# Patient Record
Sex: Female | Born: 1990 | Race: Black or African American | Hispanic: No | Marital: Single
Health system: Southern US, Community
[De-identification: ages and names within clinical notes are randomized; demographics above are authoritative.]

## PROBLEM LIST (undated history)

## (undated) DIAGNOSIS — J45909 Unspecified asthma, uncomplicated: Secondary | ICD-10-CM

## (undated) HISTORY — PX: WISDOM TOOTH EXTRACTION: SHX21

---

## 2010-12-27 ENCOUNTER — Inpatient Hospital Stay (HOSPITAL_COMMUNITY)
Admission: AD | Admit: 2010-12-27 | Discharge: 2010-12-27 | Disposition: A | Discharge status: Home / Self Care | Payer: PRIVATE HEALTH INSURANCE | Source: Ambulatory Visit | Attending: Obstetrics & Gynecology | Admitting: Obstetrics & Gynecology

## 2010-12-27 DIAGNOSIS — N946 Dysmenorrhea, unspecified: Secondary | ICD-10-CM | POA: Insufficient documentation

## 2010-12-27 LAB — URINALYSIS, ROUTINE W REFLEX MICROSCOPIC
Bilirubin Urine: NEGATIVE
Ketones, ur: NEGATIVE mg/dL
Leukocytes, UA: NEGATIVE
Nitrite: NEGATIVE
Specific Gravity, Urine: >1.03 — ABNORMAL HIGH (ref 1.005–1.030)
Urine Glucose, Fasting: NEGATIVE mg/dL
pH: 6 (ref 5.0–8.0)

## 2010-12-27 LAB — URINE MICROSCOPIC-ADD ON

## 2011-02-11 ENCOUNTER — Emergency Department (HOSPITAL_COMMUNITY)
Admission: EM | Admit: 2011-02-11 | Discharge: 2011-02-11 | Disposition: A | Discharge status: Home / Self Care | Payer: PRIVATE HEALTH INSURANCE | Attending: Emergency Medicine | Admitting: Emergency Medicine

## 2011-02-11 DIAGNOSIS — F1010 Alcohol abuse, uncomplicated: Secondary | ICD-10-CM | POA: Insufficient documentation

## 2014-01-31 ENCOUNTER — Emergency Department (HOSPITAL_BASED_OUTPATIENT_CLINIC_OR_DEPARTMENT_OTHER): Payer: No Typology Code available for payment source

## 2014-01-31 ENCOUNTER — Encounter (HOSPITAL_BASED_OUTPATIENT_CLINIC_OR_DEPARTMENT_OTHER): Payer: Self-pay | Admitting: Emergency Medicine

## 2014-01-31 ENCOUNTER — Emergency Department (HOSPITAL_BASED_OUTPATIENT_CLINIC_OR_DEPARTMENT_OTHER)
Admission: EM | Admit: 2014-01-31 | Discharge: 2014-01-31 | Disposition: A | Discharge status: Home / Self Care | Payer: No Typology Code available for payment source | Attending: Emergency Medicine | Admitting: Emergency Medicine

## 2014-01-31 DIAGNOSIS — R200 Anesthesia of skin: Secondary | ICD-10-CM | POA: Diagnosis present

## 2014-01-31 DIAGNOSIS — R5383 Other fatigue: Secondary | ICD-10-CM

## 2014-01-31 DIAGNOSIS — J45909 Unspecified asthma, uncomplicated: Secondary | ICD-10-CM | POA: Insufficient documentation

## 2014-01-31 DIAGNOSIS — R202 Paresthesia of skin: Secondary | ICD-10-CM

## 2014-01-31 DIAGNOSIS — R209 Unspecified disturbances of skin sensation: Secondary | ICD-10-CM | POA: Insufficient documentation

## 2014-01-31 DIAGNOSIS — R5381 Other malaise: Secondary | ICD-10-CM | POA: Insufficient documentation

## 2014-01-31 DIAGNOSIS — Z3202 Encounter for pregnancy test, result negative: Secondary | ICD-10-CM | POA: Insufficient documentation

## 2014-01-31 HISTORY — DX: Unspecified asthma, uncomplicated: J45.909

## 2014-01-31 LAB — PREGNANCY, URINE: Preg Test, Ur: NEGATIVE

## 2014-01-31 LAB — RAPID URINE DRUG SCREEN, HOSP PERFORMED
AMPHETAMINES: NOT DETECTED
Barbiturates: NOT DETECTED
Benzodiazepines: NOT DETECTED
Cocaine: NOT DETECTED
OPIATES: NOT DETECTED
TETRAHYDROCANNABINOL: POSITIVE — AB

## 2014-01-31 LAB — PROTIME-INR
INR: 1.05 (ref 0.00–1.49)
Prothrombin Time: 13.5 seconds (ref 11.6–15.2)

## 2014-01-31 LAB — COMPREHENSIVE METABOLIC PANEL
ALT: 13 U/L (ref 0–35)
AST: 20 U/L (ref 0–37)
Albumin: 4.8 g/dL (ref 3.5–5.2)
Alkaline Phosphatase: 84 U/L (ref 39–117)
BUN: 9 mg/dL (ref 6–23)
CALCIUM: 10 mg/dL (ref 8.4–10.5)
CO2: 26 meq/L (ref 19–32)
CREATININE: 0.8 mg/dL (ref 0.50–1.10)
Chloride: 106 mEq/L (ref 96–112)
GLUCOSE: 79 mg/dL (ref 70–99)
Potassium: 3.9 mEq/L (ref 3.7–5.3)
Sodium: 146 mEq/L (ref 137–147)
Total Bilirubin: 0.6 mg/dL (ref 0.3–1.2)
Total Protein: 8.2 g/dL (ref 6.0–8.3)

## 2014-01-31 LAB — CBC
HEMATOCRIT: 42.9 % (ref 36.0–46.0)
HEMOGLOBIN: 14.4 g/dL (ref 12.0–15.0)
MCH: 31.5 pg (ref 26.0–34.0)
MCHC: 33.6 g/dL (ref 30.0–36.0)
MCV: 93.9 fL (ref 78.0–100.0)
Platelets: 252 10*3/uL (ref 150–400)
RBC: 4.57 MIL/uL (ref 3.87–5.11)
RDW: 12.6 % (ref 11.5–15.5)
WBC: 7.3 10*3/uL (ref 4.0–10.5)

## 2014-01-31 LAB — TROPONIN I

## 2014-01-31 LAB — URINALYSIS, ROUTINE W REFLEX MICROSCOPIC
BILIRUBIN URINE: NEGATIVE
Glucose, UA: NEGATIVE mg/dL
HGB URINE DIPSTICK: NEGATIVE
Ketones, ur: 40 mg/dL — AB
Leukocytes, UA: NEGATIVE
Nitrite: NEGATIVE
PROTEIN: NEGATIVE mg/dL
Specific Gravity, Urine: 1.029 (ref 1.005–1.030)
UROBILINOGEN UA: 1 mg/dL (ref 0.0–1.0)
pH: 6 (ref 5.0–8.0)

## 2014-01-31 MED ORDER — PREDNISONE 10 MG PO TABS: 20.0000 mg | ORAL_TABLET | Freq: Every day | ORAL | Status: DC

## 2014-01-31 MED ORDER — PREDNISONE 20 MG PO TABS: 20.0000 mg | ORAL_TABLET | Freq: Every day | ORAL | Status: DC

## 2014-01-31 NOTE — ED Provider Notes (Signed)
CSN: [id number]     Arrival date & time 01/31/14  1731 History  This chart was scribed for Junius ArgyleForrest S Deeanne Deininger, MD by Elveria Risingimelie Horne, ED scribe.  This patient was seen in room MH07/MH07 and the patient's care was started at 6:27 PM.   Chief Complaint  Patient presents with  . Numbness      Patient is a 23 y.o. female presenting with extremity weakness. The history is provided by the patient. No language interpreter was used.  Extremity Weakness Pertinent negatives include no headaches. Nothing aggravates the symptoms. Nothing relieves the symptoms.   HPI Comments: Natalie Moreno is a 23 y.o. female who presents to the Emergency Department complaining of right arm numbness, onset last night. Patient reports that last night around 3am while engaging in intercourse she felt a sudden jolt like sensation that travelled up her arm. Patient immediately felt a numbness and tingling in right arm. Patient reports that the intercourse was not characteristically violent and she denies causative trauma or injury. Patient denies any pain or weakness in the arm, but reports feeling clumsy today at work.  Patient mentions tingling at the tip of her tongue briefly throughout the day and toe numbness that has presented intermittently for a few months. Patient shares that she has herpes for which she takes Valtrex when experiencing an outbreak. Patient is not currently experiencing an outbreak and hasn't for a while.    Past Medical History  Diagnosis Date  . Asthma    Past Surgical History  Procedure Laterality Date  . Wisdom tooth extraction     No family history on file. History  Substance Use Topics  . Smoking status: Not on file  . Smokeless tobacco: Not on file  . Alcohol Use: Not on file   OB History   Grav Para Term Preterm Abortions TAB SAB Ect Mult Living                 Review of Systems  Constitutional: Negative for fever.  HENT: Negative for congestion and sore throat.   Eyes: Negative  for pain.  Respiratory: Negative for choking and wheezing.   Gastrointestinal: Negative for vomiting and diarrhea.  Genitourinary: Negative for dysuria.  Musculoskeletal: Positive for extremity weakness. Negative for neck pain.  Skin: Negative for rash.  Allergic/Immunologic: Negative for immunocompromised state.  Neurological: Positive for numbness. Negative for weakness and headaches.  Hematological: Negative for adenopathy.  Psychiatric/Behavioral: Negative for behavioral problems.      Allergies  Flagyl  Home Medications   Current Outpatient Rx  Name  Route  Sig  Dispense  Refill  . valACYclovir (VALTREX) 500 MG tablet   Oral   Take 500 mg by mouth as needed.          Triage Vitals: BP 132/85  Pulse 62  Temp(Src) 98.9 F (37.2 C) (Oral)  Resp 20  Ht 5\' 8"  (1.727 m)  Wt 160 lb (72.576 kg)  BMI 24.33 kg/m2  SpO2 100%  LMP 01/18/2014 Physical Exam  Nursing note and vitals reviewed. Constitutional: She is oriented to person, place, and time. She appears well-developed and well-nourished. No distress.  HENT:  Head: Normocephalic and atraumatic.  Mouth/Throat: Oropharynx is clear and moist. No oropharyngeal exudate.  Eyes: Conjunctivae and EOM are normal. Pupils are equal, round, and reactive to light. Right eye exhibits no discharge. Left eye exhibits no discharge.  Neck: Neck supple. No tracheal deviation present.  Cardiovascular: Normal rate, regular rhythm, normal heart sounds and intact distal  pulses.  Exam reveals no gallop and no friction rub.   No murmur heard. Pulmonary/Chest: Effort normal and breath sounds normal. No respiratory distress. She has no wheezes. She has no rales. She exhibits no tenderness.  Abdominal: Soft. Bowel sounds are normal. She exhibits no distension and no mass. There is no tenderness. There is no rebound and no guarding.  Musculoskeletal: Normal range of motion. She exhibits no edema and no tenderness.  Neurological: She is alert and  oriented to person, place, and time. She has normal strength. A sensory deficit is present. No cranial nerve deficit. She displays a negative Romberg sign. Coordination and gait normal.  Reflex Scores:      Tricep reflexes are 2+ on the right side and 2+ on the left side.      Bicep reflexes are 2+ on the right side and 2+ on the left side.      Brachioradialis reflexes are 2+ on the right side and 2+ on the left side.      Patellar reflexes are 2+ on the right side and 2+ on the left side.      Achilles reflexes are 2+ on the right side and 2+ on the left side. alert, oriented x3 speech: normal in context and clarity memory: intact grossly cranial nerves II-XII: intact motor strength: full proximally and distally no involuntary movements or tremors Sensation: altered sensation to light touch diffusely in the right arm, otherwise intact to light touch  cerebellar: finger-to-nose and heel-to-shin intact gait: normal forwards and backwards, normal tandem gait   Skin: Skin is warm and dry. No rash noted. No erythema.  Psychiatric: She has a normal mood and affect. Her behavior is normal.    ED Course  Procedures (including critical care time) DIAGNOSTIC STUDIES: Oxygen Saturation is 100% on room air, normal by my interpretation.    COORDINATION OF CARE: 6:33 PM- Will order CT of head. Pt advised of plan for treatment and pt agrees.    Labs Review Labs Reviewed  URINE RAPID DRUG SCREEN (HOSP PERFORMED) - Abnormal; Notable for the following:    Tetrahydrocannabinol POSITIVE (*)    All other components within normal limits  URINALYSIS, ROUTINE W REFLEX MICROSCOPIC - Abnormal; Notable for the following:    Ketones, ur 40 (*)    All other components within normal limits  TROPONIN I  PROTIME-INR  CBC  COMPREHENSIVE METABOLIC PANEL  PREGNANCY, URINE   Imaging Review Ct Head Wo Contrast  01/31/2014   CLINICAL DATA:  Right arm numbness  EXAM: CT HEAD WITHOUT CONTRAST  TECHNIQUE:  Contiguous axial images were obtained from the base of the skull through the vertex without intravenous contrast. Study was obtained within 24 hr of patient's arrival at the emergency department.  COMPARISON:  None.  FINDINGS: The ventricles are normal in size and configuration. There is no mass, hemorrhage, extra-axial fluid collection, or midline shift. Gray-white compartments appear normal. No acute infarct apparent. Bony calvarium appears intact. Mastoid air cells are clear.  IMPRESSION: Study within normal limits. No intracranial mass, hemorrhage, or acute appearing infarct.   Electronically Signed   By: Bretta Bang M.D.   On: 01/31/2014 19:09     EKG Interpretation None      MDM   Final diagnoses:  Numbness and tingling of right arm    8:17 PM 23 y.o. female who presents with right arm numbness which began suddenly during intercourse at 3 AM this morning. Symptoms have persisted but have not worsened. She  has no weakness on exam or focal tenderness. She is afebrile and vital signs are unremarkable here. Screening imaging and lab work was performed. I discussed the case with Dr. Amada Jupiter (neurology), suspect cervical radiculopathy. Will get outpt neuro f/u and course of steroids.   8:18 PM: Pt continues to appear well. I interpreted/reviewed the labs and/or imaging which were non-contributory.   I have discussed the diagnosis/risks/treatment options with the patient and believe the pt to be eligible for discharge home to follow-up with Neurology as outpt. We also discussed returning to the ED immediately if new or worsening sx occur. We discussed the sx which are most concerning (e.g., weakness, worsening numbness, fever) that necessitate immediate return. Medications administered to the patient during their visit and any new prescriptions provided to the patient are listed below.  Medications given during this visit Medications - No data to display  Discharge Medication List as of  01/31/2014  8:32 PM    START taking these medications   Details  predniSONE (DELTASONE) 20 MG tablet Take 1 tablet (20 mg total) by mouth daily with breakfast. Take 3 tablets by mouth daily for 5 days. Then take 2 tablets daily by mouth for 3 days. Then take 1 tablet by mouth daily for 2 days., Starting 01/31/2014, Until Discontinued, Print          I personally performed the services described in this documentation, which was scribed in my presence. The recorded information has been reviewed and is accurate.    Junius Argyle, MD 02/01/14 [phone]

## 2014-01-31 NOTE — ED Notes (Signed)
Pt reports right arm became during intercourse this morning around 0300- states still fees "asleep"

## 2015-01-21 IMAGING — CT CT HEAD W/O CM
1 series · 16 of 30 positions shown, 20 images · non-contrast
Comparison: None.

CLINICAL DATA: Right arm numbness

EXAM:
CT HEAD WITHOUT CONTRAST
TECHNIQUE: Contiguous axial images were obtained from the base of the skull
through the vertex without intravenous contrast. Study was obtained
within 24 hr of patient's arrival at the emergency department.

[Series 2: head 4.8 h37s · axial · 0.45mm/px · z∈[-187,-54]mm · 16 of 32 slices shown, 20 images]
[im 2/32  brain]
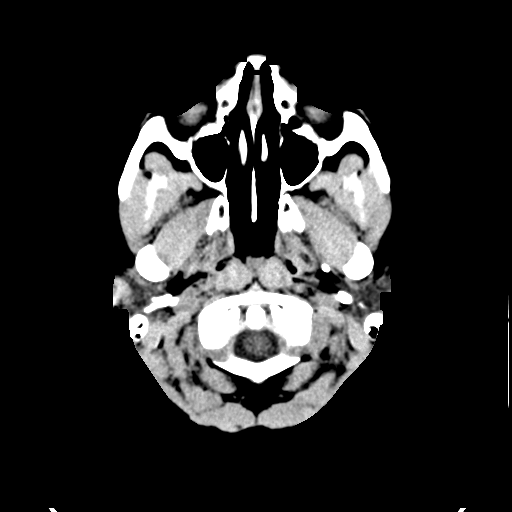
[im 2/32  bone]
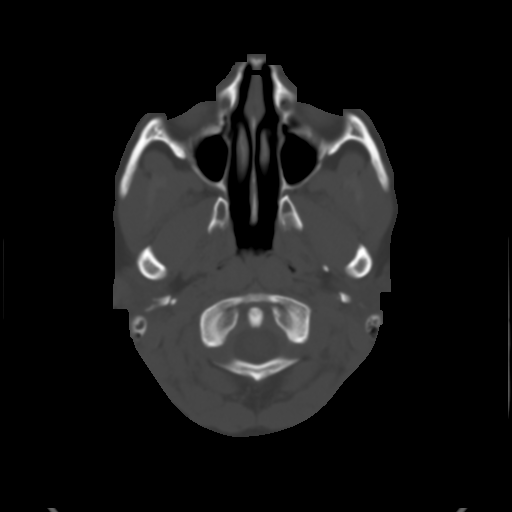
[im 4/32  brain]
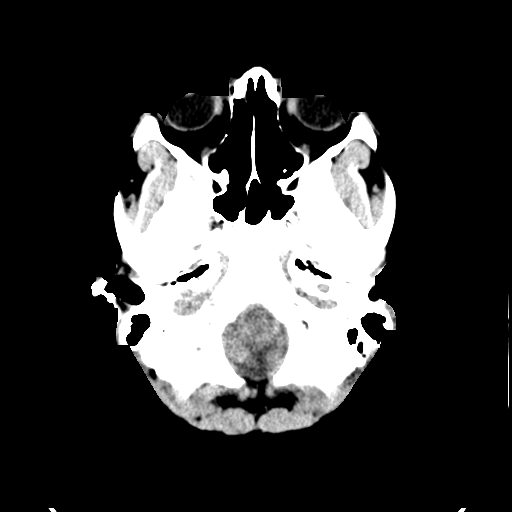
[im 6/32  brain]
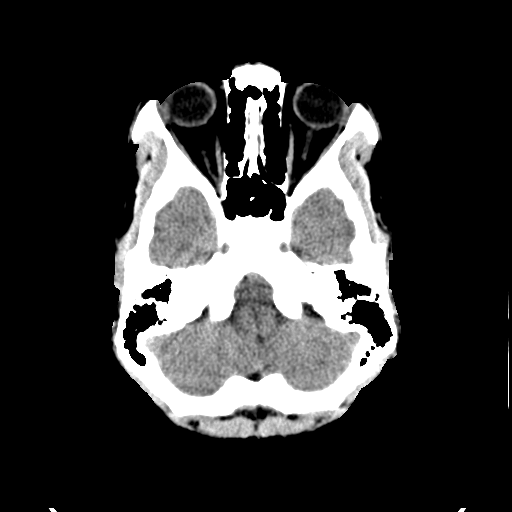
[im 8/32  brain]
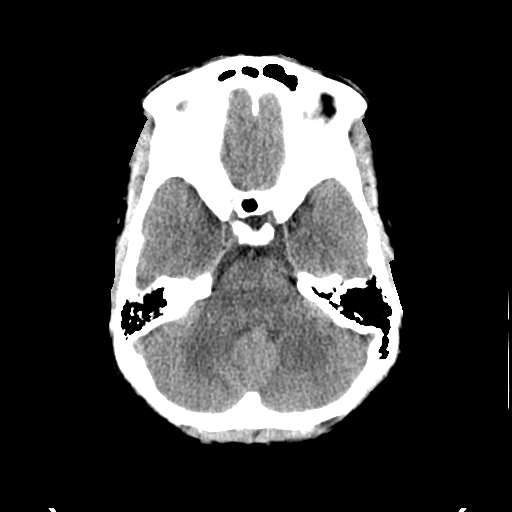
[im 9/32  brain]
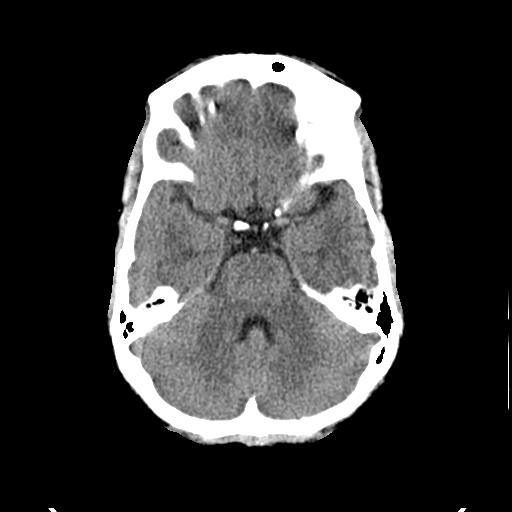
[im 9/32  bone]
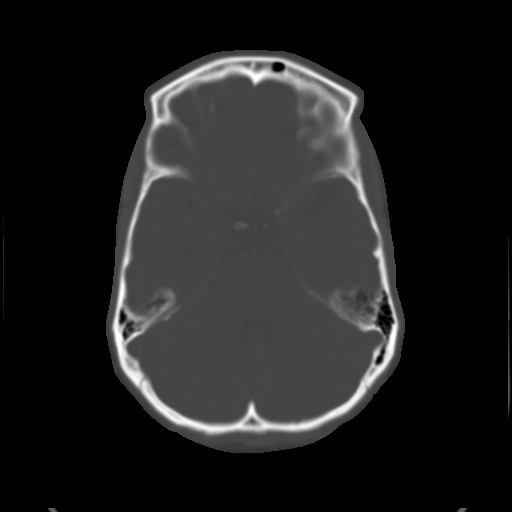
[im 11/32  brain]
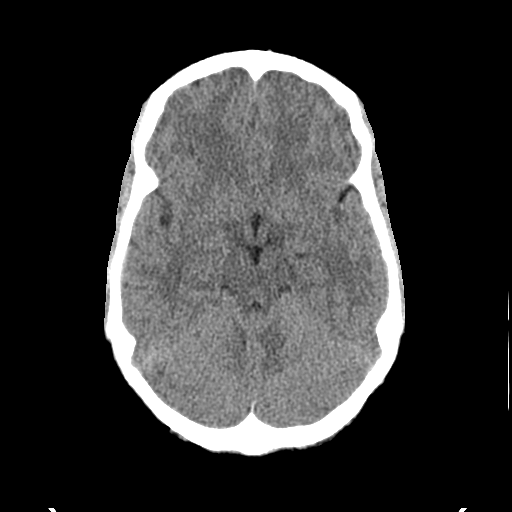
[im 13/32  brain]
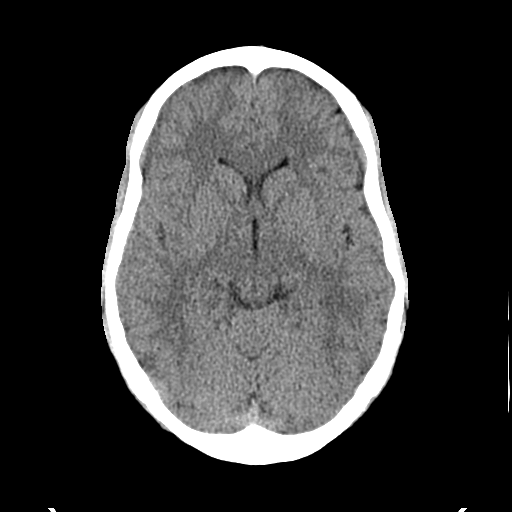
[im 15/32  brain]
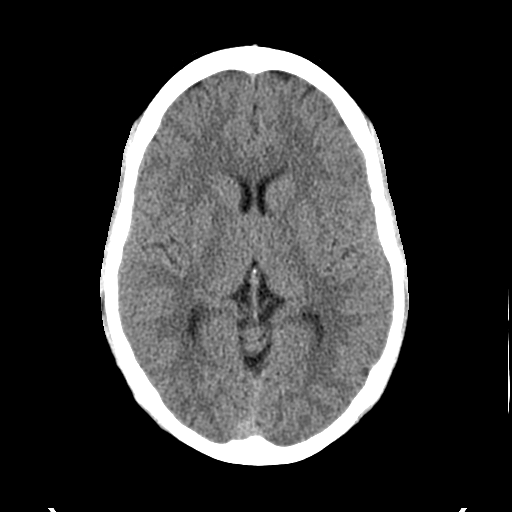
[im 17/32  brain]
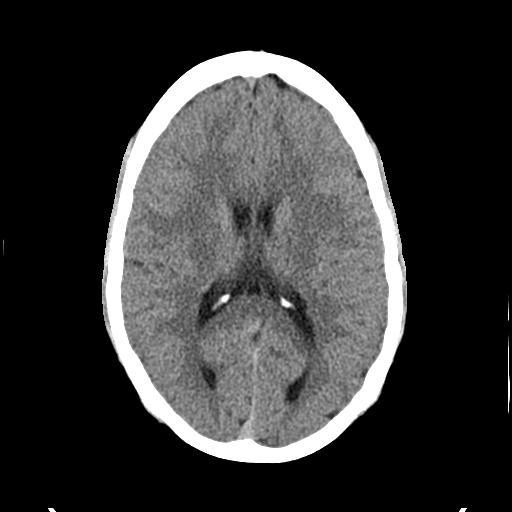
[im 17/32  bone]
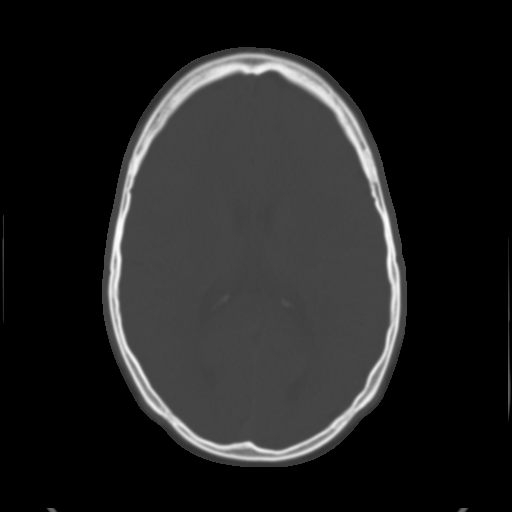
[im 19/32  brain]
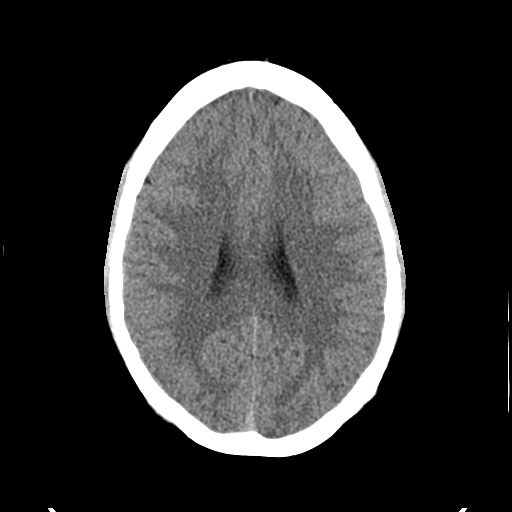
[im 21/32  brain]
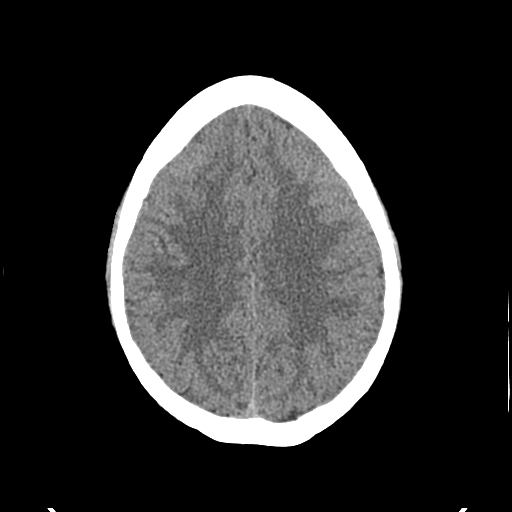
[im 23/32  brain]
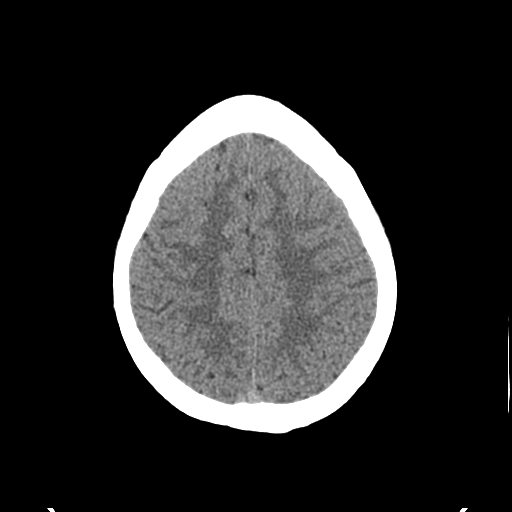
[im 24/32  brain]
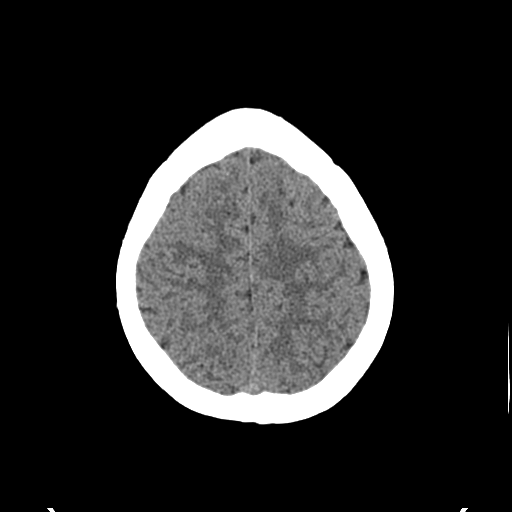
[im 24/32  bone]
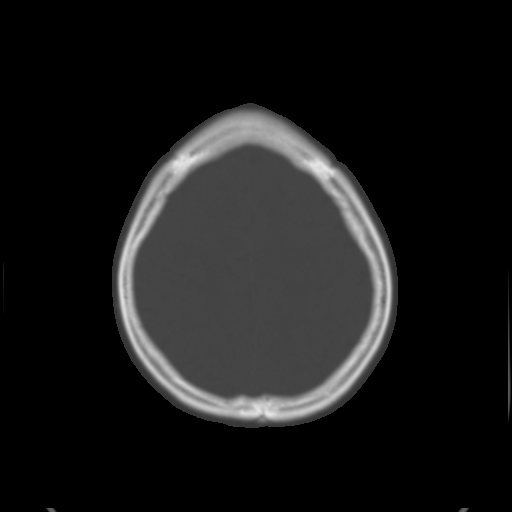
[im 26/32  brain]
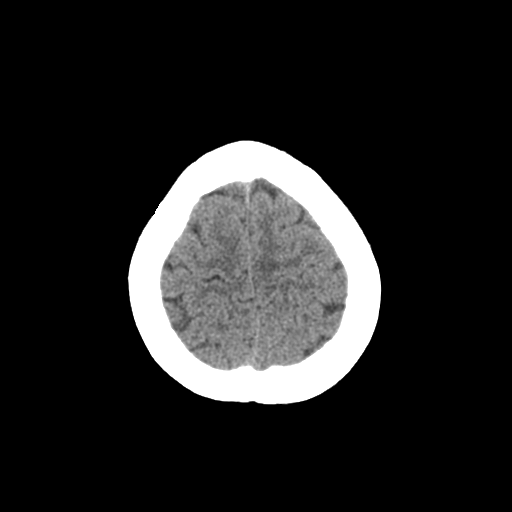
[im 28/32  brain]
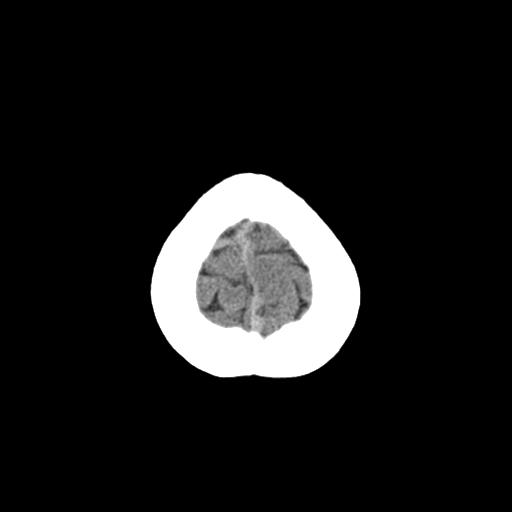
[im 30/32  brain]
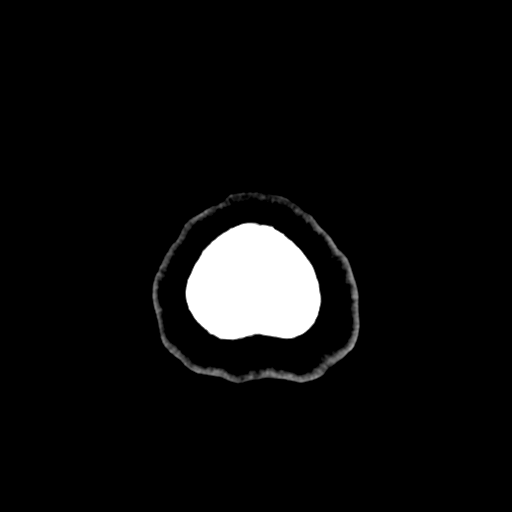

[16 of 30 positions shown; findings below may reference images not displayed]

FINDINGS: The ventricles are normal in size and configuration. There is no
mass, hemorrhage, extra-axial fluid collection, or midline shift.
Gray-white compartments appear normal. No acute infarct apparent.
Bony calvarium appears intact. Mastoid air cells are clear.
IMPRESSION: Study within normal limits. No intracranial mass, hemorrhage, or
acute appearing infarct.

## 2024-10-19 ENCOUNTER — Ambulatory Visit: Admission: EM | Admit: 2024-10-19 | Discharge: 2024-10-19 | Disposition: A | Discharge status: Home / Self Care

## 2024-10-19 DIAGNOSIS — H538 Other visual disturbances: Secondary | ICD-10-CM | POA: Diagnosis not present

## 2024-10-19 DIAGNOSIS — I160 Hypertensive urgency: Secondary | ICD-10-CM

## 2024-10-19 DIAGNOSIS — R519 Headache, unspecified: Secondary | ICD-10-CM

## 2024-10-19 DIAGNOSIS — R002 Palpitations: Secondary | ICD-10-CM | POA: Diagnosis not present

## 2024-10-19 NOTE — Discharge Instructions (Signed)
 Given your evaluation today and risk factors, I recommend you go directly to the ER for further imaging and evaluation.  Please noted our office is limited in the tests and imaging we can perform at our facility. Your evaluation was indicates a higher level of care is needed at this time.

## 2024-10-19 NOTE — ED Triage Notes (Signed)
 Patient reports her blood pressure has been elevated today. Reports being under a lot of stress. Blood pressure earlier today was 180 systolic. She has taken both doses of her blood pressure medication today. Patient reports a headache with blurred vision.

## 2024-10-19 NOTE — ED Provider Notes (Signed)
 " [facility]  Note:  This document was prepared using Dragon voice recognition software and may include unintentional dictation errors.  MRN: [id number] DOB: 01-01-1991 DATE: 10/19/2024   Subjective:  Chief Complaint:  Chief Complaint  Patient presents with   Hypertension   Dizziness   Headache    HPI: Natalie Moreno is a 33 y.o. female presenting for elevated blood pressure and palpitations for less than one day. Patient presents concerned for elevated blood pressure with vision changes and palpitations. Patient reports that his morning she woke up at BP was 186/104. She states at that time she took both her morning BP medication and evening medication at the same time. She reports ongoing headaches and blurred vision throughout the day as well as heart palpitations. She states that something similar happened last month and she was admitted due to elevated troponin at that time. She reports no chest pain or numbness/tingling, but did occur at her prior hospitalization. Patient states she has been under a lot of stress lately and had to put her cat down today. She is unsure if she had a panic attack earlier or not. Denies fever, nausea/vomiting, abdominal pain, numbness/tingling, chest pain, SOB. Endorses palpitations, headache, blurred vision, dizziness. Presents NAD.  Prior to Admission medications  Medication Sig Start Date End Date Taking? Authorizing Provider  irbesartan (AVAPRO) 75 MG tablet Take 75 mg by mouth daily.   Yes [provider]  metoprolol succinate (TOPROL-XL) 25 MG 24 hr tablet Take 25 mg by mouth. 09/18/24  Yes [provider]  valACYclovir (VALTREX) 500 MG tablet Take 500 mg by mouth as needed.    [provider]     Allergies[1]  History:   Past Medical History:  Diagnosis Date   Asthma      Past Surgical History:  Procedure Laterality Date   WISDOM TOOTH EXTRACTION      History reviewed. No pertinent family  history.  Social History[2]  Review of Systems  Constitutional:  Negative for fever.  Respiratory:  Negative for shortness of breath.   Cardiovascular:  Positive for palpitations. Negative for chest pain.  Gastrointestinal:  Negative for abdominal pain, nausea and vomiting.  Neurological:  Positive for dizziness and headaches. Negative for seizures, syncope and numbness.     Objective:   Vitals: BP (!) 153/100 (BP Location: Left Arm)   Pulse 98   Temp 99.4 F (37.4 C) (Oral)   Resp 18   LMP 10/01/2024 (Approximate)   SpO2 98%   Physical Exam Constitutional:      General: She is not in acute distress.    Appearance: Normal appearance. She is well-developed. She is obese. She is not ill-appearing or toxic-appearing.  HENT:     Head: Normocephalic and atraumatic.  Cardiovascular:     Rate and Rhythm: Normal rate and regular rhythm.     Heart sounds: Normal heart sounds.  Pulmonary:     Effort: Pulmonary effort is normal.     Breath sounds: Normal breath sounds.     Comments: Clear to auscultation bilaterally  Abdominal:     General: Bowel sounds are normal.     Palpations: Abdomen is soft.     Tenderness: There is no abdominal tenderness.  Skin:    General: Skin is warm and dry.  Neurological:     General: No focal deficit present.     Mental Status: She is alert and oriented to person, place, and time.     GCS: GCS eye subscore  is 4. GCS verbal subscore is 5. GCS motor subscore is 6.  Psychiatric:        Mood and Affect: Affect normal. Mood is depressed.     Results:  Labs: No results found for this or any previous visit (from the past 24 hours).  Radiology: No results found.   UC Course/Treatments:  Procedures: ED EKG  Date/Time: 10/19/2024 4:06 PM  Performed by: Basilia Ulanda SQUIBB, PA-C Authorized by: Basilia Ulanda SQUIBB, PA-C   Previous ECG:    Previous ECG:  Unavailable Rate:    ECG rate:  84   ECG rate assessment: normal   Rhythm:    Rhythm:  sinus rhythm   Ectopy:    Ectopy: none   QRS:    QRS axis:  Normal   QRS intervals:  Normal   QRS conduction: normal   ST segments:    ST segments:  Non-specific    Medications Ordered in UC: Medications - No data to display   Assessment and Plan :     ICD-10-CM   1. Palpitations  R00.2     2. Blurred vision  H53.8     3. Acute nonintractable headache, unspecified headache type  R51.9     4. Hypertensive urgency  I16.0       Palpitations Blurred vision Acute nonintractable headache, unspecified headache type Hypertensive urgency Patient presented to urgent care complaining of possible elevated blood pressure with palpitations and blurred vision.   BP was noted to be 153/100 at triage. She reports BP this morning was 186/104. She took both her irbesartan and metoprolol at that time.   Patient has history of hypertensive emergency with elevated troponin that required hospitalization a month ago.   Patient reports palpations and headache with blurred vision ongoing throughout the day.   EKG showed NSR with nonspecific ST changes.  Given history of hypertensive emergency with elevated BP and blurred vision with palpations, it is recommend that patient go directly to the ER for further evaluation/treatment/observation. Concern for possible hypertensive emergency.   Patient is stable for discharge to the emergency department. she will be going to the ER via EMS.  EMS arrived at 4:04pm and patient was transferred to there care. Report was given to EMT.   ED Discharge Orders     None        PDMP not reviewed this encounter.      [1]  Allergies Allergen Reactions   Flagyl [Metronidazole] Nausea And Vomiting   Spironolactone Palpitations    migraine  [2]  Social History Tobacco Use   Smoking status: Unknown   Smokeless tobacco: Never     Presleigh Feldstein P, PA-C 10/19/24 1607  "
